# Patient Record
Sex: Male | Born: 1995 | Race: Black or African American | Hispanic: No | Marital: Single | Smoking: Never smoker
Health system: Southern US, Community
[De-identification: ages and names within clinical notes are randomized; demographics above are authoritative.]

## PROBLEM LIST (undated history)

## (undated) HISTORY — PX: ABDOMINAL SURGERY: SHX537

---
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
## 2018-06-13 ENCOUNTER — Emergency Department
Admission: EM | Admit: 2018-06-13 | Discharge: 2018-06-13 | Disposition: A | Discharge status: Home / Self Care | Payer: Medicaid Other | Attending: Emergency Medicine | Admitting: Emergency Medicine

## 2018-06-13 ENCOUNTER — Other Ambulatory Visit: Payer: Self-pay

## 2018-06-13 ENCOUNTER — Emergency Department: Payer: Medicaid Other

## 2018-06-13 DIAGNOSIS — Y9367 Activity, basketball: Secondary | ICD-10-CM | POA: Insufficient documentation

## 2018-06-13 DIAGNOSIS — S63502A Unspecified sprain of left wrist, initial encounter: Secondary | ICD-10-CM | POA: Insufficient documentation

## 2018-06-13 DIAGNOSIS — Y998 Other external cause status: Secondary | ICD-10-CM | POA: Insufficient documentation

## 2018-06-13 DIAGNOSIS — X58XXXA Exposure to other specified factors, initial encounter: Secondary | ICD-10-CM | POA: Insufficient documentation

## 2018-06-13 DIAGNOSIS — Y929 Unspecified place or not applicable: Secondary | ICD-10-CM | POA: Insufficient documentation

## 2018-06-13 NOTE — ED Triage Notes (Signed)
C/o L wrist pain since Monday. Was playing bball Monday morning but denies known injury. A&O, ambulatory. No distress noted.

## 2018-06-13 NOTE — ED Provider Notes (Signed)
[facility] Emergency Department Provider Note  ____________________________________________   First MD Initiated Contact with Patient 06/13/18 1926     (approximate)  I have reviewed the triage vital signs and the nursing notes.   HISTORY  Chief Complaint Wrist Pain    HPI Norman Rubio is a 23 y.o. male states he was playing basketball early Monday morning and started having wrist pain.  He denies any type of fall.  He states it hurts to move it and bend it.  He denies any numbness or tingling.  He states he needs a work note.    History reviewed. No pertinent past medical history.  There are no active problems to display for this patient.   Past Surgical History:  Procedure Laterality Date  . ABDOMINAL SURGERY     unknown, occurred as infant    Prior to Admission medications   Not on File    Allergies Patient has no known allergies.  History reviewed. No pertinent family history.  Social History Social History   Tobacco Use  . Smoking status: Never Smoker  . Smokeless tobacco: Never Used  Substance Use Topics  . Alcohol use: No  . Drug use: Not on file    Review of Systems  Constitutional: No fever/chills Eyes: No visual changes. ENT: No sore throat. Respiratory: Denies cough Genitourinary: Negative for dysuria. Musculoskeletal: Negative for back pain.  Positive for left wrist pain Skin: Negative for rash.    ____________________________________________   PHYSICAL EXAM:  VITAL SIGNS: ED Triage Vitals  Enc Vitals Group     BP 06/13/18 1837 (!) 146/88     Pulse Rate 06/13/18 1837 69     Resp 06/13/18 1837 18     Temp 06/13/18 1837 98.7 F (37.1 C)     Temp Source 06/13/18 1837 Oral     SpO2 06/13/18 1837 99 %     Weight 06/13/18 1837 190 lb (86.2 kg)     Height 06/13/18 1837 5\' 11"  (1.803 m)     Head Circumference --      Peak Flow --      Pain Score 06/13/18 1839 7     Pain Loc --      Pain Edu? --    Excl. in [place]? --     Constitutional: Alert and oriented. Well appearing and in no acute distress. Eyes: Conjunctivae are normal.  Head: Atraumatic. Nose: No congestion/rhinnorhea. Mouth/Throat: Mucous membranes are moist.   Neck:  supple no lymphadenopathy noted Cardiovascular: Normal rate, regular rhythm.  Respiratory: Normal respiratory effort.  No retractions GU: deferred Musculoskeletal: FROM all extremities, warm and well perfused, the left wrist is mildly tender, neurovascular is intact Neurologic:  Normal speech and language.  Skin:  Skin is warm, dry and intact. No rash noted. Psychiatric: Mood and affect are normal. Speech and behavior are normal.  ____________________________________________   LABS (all labs ordered are listed, but only abnormal results are displayed)  Labs Reviewed - No data to display ____________________________________________   ____________________________________________  RADIOLOGY  X-ray of the left wrist is negative  ____________________________________________   PROCEDURES  Procedure(s) performed: Velcro cock-up splint applied by the tech   Procedures    ____________________________________________   INITIAL IMPRESSION / ASSESSMENT AND PLAN / ED COURSE  Pertinent labs & imaging results that were available during my care of the patient were reviewed by me and considered in my medical decision making (see chart for details).   Patient is 23 year old male presents emergency  department complaining of left wrist pain  Physical exam shows left wrist is mildly tender, pain is reproduced with range of motion  X-ray left wrist is negative  Cock-up splint was applied by the tech.  He is to follow-up with orthopedics if not better in 5 to 7 days.  Is given a work note as requested.  Take Tylenol and ibuprofen and apply ice.     As part of my medical decision making, I reviewed the following data within the electronic MEDICAL RECORD NUMBER  Nursing notes reviewed and incorporated, Old chart reviewed, Radiograph reviewed x-ray of the left wrist is negative, Notes from prior ED visits and [place] Controlled Substance Database  ____________________________________________   FINAL CLINICAL IMPRESSION(S) / ED DIAGNOSES  Final diagnoses:  Sprain of left wrist, initial encounter      NEW MEDICATIONS STARTED DURING THIS VISIT:  New Prescriptions   No medications on file     Note:  This document was prepared using Dragon voice recognition software and may include unintentional dictation errors.    Faythe Ghee, PA-C 06/13/18 1950    Phineas Semen, MD 06/13/18 Rosamaria Lints

## 2018-06-13 NOTE — Discharge Instructions (Signed)
Take ibuprofen or Tylenol for pain as needed.  Elevate and ice the wrist.  Return if worsening.  Follow-up with orthopedics if not better in a week.

## 2018-09-18 ENCOUNTER — Other Ambulatory Visit: Payer: Self-pay

## 2018-09-18 ENCOUNTER — Emergency Department
Admission: EM | Admit: 2018-09-18 | Discharge: 2018-09-18 | Disposition: A | Discharge status: Home / Self Care | Payer: Medicaid Other | Attending: Emergency Medicine | Admitting: Emergency Medicine

## 2018-09-18 DIAGNOSIS — M25562 Pain in left knee: Secondary | ICD-10-CM | POA: Insufficient documentation

## 2018-09-18 DIAGNOSIS — G8929 Other chronic pain: Secondary | ICD-10-CM | POA: Insufficient documentation

## 2018-09-18 DIAGNOSIS — M545 Low back pain: Secondary | ICD-10-CM | POA: Insufficient documentation

## 2018-09-18 MED ORDER — KETOROLAC TROMETHAMINE 30 MG/ML IJ SOLN
30.0000 mg | Freq: Once | INTRAMUSCULAR | Status: AC
  Administered 2018-09-18: 30 mg via INTRAMUSCULAR
  Filled 2018-09-18: qty 1

## 2018-09-18 MED ORDER — CYCLOBENZAPRINE HCL 5 MG PO TABS: ORAL_TABLET | ORAL | 0 refills | Status: DC

## 2018-09-18 MED ORDER — MELOXICAM 15 MG PO TABS: 15.0000 mg | ORAL_TABLET | Freq: Every day | ORAL | 0 refills | Status: AC

## 2018-09-18 NOTE — ED Triage Notes (Signed)
Pt c/o lower back and left knee pain since playing basketball yesterday. Denies injury

## 2018-09-18 NOTE — ED Provider Notes (Signed)
[facility] Emergency Department Provider Note  ____________________________________________  Time seen: Approximately 10:52 AM  I have reviewed the triage vital signs and the nursing notes.   HISTORY  Chief Complaint Back Pain and Knee Pain    HPI Norman MohJaquan Tumbleson is a 23 y.o. male that presents to the emergency department for evaluation of left knee pain and low back pain for "a minute.  "Patient states pain has been at least for a couple of months.  He states he was evaluated in this emergency department for this previously.  He has had a referral to orthopedics in the past but has never followed up.  Pain is worse when he is on his feet all day or when he is playing basketball.  He plays basketball on concrete.  No trauma.  No urinary symptoms or history of kidney stone.  History reviewed. No pertinent past medical history.  There are no active problems to display for this patient.   Past Surgical History:  Procedure Laterality Date  . ABDOMINAL SURGERY     unknown, occurred as infant    Prior to Admission medications   Medication Sig Start Date End Date Taking? Authorizing Provider  cyclobenzaprine (FLEXERIL) 5 MG tablet Take 1-2 tablets 3 times daily as needed 09/18/18   Enid DerryWagner, Shenee Wignall, PA-C  meloxicam (MOBIC) 15 MG tablet Take 1 tablet (15 mg total) by mouth daily for 10 days. 09/18/18 09/28/18  Enid DerryWagner, Landrie Beale, PA-C    Allergies Patient has no known allergies.  No family history on file.  Social History Social History   Tobacco Use  . Smoking status: Never Smoker  . Smokeless tobacco: Never Used  Substance Use Topics  . Alcohol use: No  . Drug use: Not Currently     Review of Systems  Cardiovascular: No chest pain. Respiratory: No SOB. Gastrointestinal: No abdominal pain.  No nausea, no vomiting.  Genitourinary: Negative for dysuria. Musculoskeletal: Positive for knee and back pain. Skin: Negative for rash, abrasions, lacerations,  ecchymosis. Neurological: Negative for headaches, numbness or tingling   ____________________________________________   PHYSICAL EXAM:  VITAL SIGNS: ED Triage Vitals [09/18/18 1038]  Enc Vitals Group     BP 134/81     Pulse Rate (!) 58     Resp 16     Temp 98.1 F (36.7 C)     Temp Source Oral     SpO2 100 %     Weight 170 lb (77.1 kg)     Height 6' (1.829 m)     Head Circumference      Peak Flow      Pain Score 8     Pain Loc      Pain Edu?      Excl. in GC?      Constitutional: Alert and oriented. Well appearing and in no acute distress. Eyes: Conjunctivae are normal. PERRL. EOMI. Head: Atraumatic. ENT:      Ears:      Nose: No congestion/rhinnorhea.      Mouth/Throat: Mucous membranes are moist.  Neck: No stridor.  Cardiovascular: Normal rate, regular rhythm.  Good peripheral circulation. Respiratory: Normal respiratory effort without tachypnea or retractions. Lungs CTAB. Good air entry to the bases with no decreased or absent breath sounds. Musculoskeletal: Full range of motion to all extremities. No gross deformities appreciated.  Full range of motion of left knee without pain.  Mild tenderness to palpation through lumbar spine and lumbar paraspinal muscles.  No pinpoint tenderness to palpation.  Full  range of motion with flexion, extension, rotation of the lumbar spine.  Normal gait. Neurologic:  Normal speech and language. No gross focal neurologic deficits are appreciated.  Skin:  Skin is warm, dry and intact. No rash noted. Psychiatric: Mood and affect are normal. Speech and behavior are normal. Patient exhibits appropriate insight and judgement.   ____________________________________________   LABS (all labs ordered are listed, but only abnormal results are displayed)  Labs Reviewed - No data to display ____________________________________________  EKG   ____________________________________________  RADIOLOGY  No results  found.  ____________________________________________    PROCEDURES  Procedure(s) performed:    Procedures    Medications  ketorolac (TORADOL) 30 MG/ML injection 30 mg (30 mg Intramuscular Given 09/18/18 1145)     ____________________________________________   INITIAL IMPRESSION / ASSESSMENT AND PLAN / ED COURSE  Pertinent labs & imaging results that were available during my care of the patient were reviewed by me and considered in my medical decision making (see chart for details).  Review of the [place] CSRS was performed in accordance of the [facility] prior to dispensing any controlled drugs.     Patient presented the emergency department for evaluation of low back pain and left knee pain for several months.  Pain has not changed in character today.  Exam is overall unremarkable.  Patient will be discharged home with prescriptions for Mobic. Patient is to follow up with orthopedics as directed. Patient is given ED precautions to return to the ED for any worsening or new symptoms.     ____________________________________________  FINAL CLINICAL IMPRESSION(S) / ED DIAGNOSES  Final diagnoses:  Acute pain of left knee  Chronic midline low back pain without sciatica      NEW MEDICATIONS STARTED DURING THIS VISIT:  ED Discharge Orders         Ordered    Knee brace     09/18/18 1129    cyclobenzaprine (FLEXERIL) 5 MG tablet     09/18/18 1137    meloxicam (MOBIC) 15 MG tablet  Daily     09/18/18 1137              This chart was dictated using voice recognition software/Dragon. Despite best efforts to proofread, errors can occur which can change the meaning. Any change was purely unintentional.    Enid Derry, PA-C 09/18/18 1517    Minna Antis, MD 09/18/18 1536

## 2018-09-18 NOTE — Discharge Instructions (Addendum)
I have given you a prescription for Flexeril to help relax your muscles and low back for inflammation in your joints.  Try heat to your lower back.  You can also ice your knee if it is bothering you after basketball.  Please make an appointment with Ortho for further evaluation.

## 2018-09-18 NOTE — ED Notes (Signed)
See triage note  Presents with lower back pain and left knee pain  States he was playing b/b/ yesterday.  States he did fall couple of times   No swelling noted  Ambulates well to treatment area

## 2018-12-28 ENCOUNTER — Other Ambulatory Visit: Payer: Self-pay

## 2018-12-28 DIAGNOSIS — Z20822 Contact with and (suspected) exposure to covid-19: Secondary | ICD-10-CM

## 2018-12-29 LAB — NOVEL CORONAVIRUS, NAA: SARS-CoV-2, NAA: NOT DETECTED

## 2019-01-08 ENCOUNTER — Other Ambulatory Visit: Payer: Self-pay

## 2019-01-08 DIAGNOSIS — Z20822 Contact with and (suspected) exposure to covid-19: Secondary | ICD-10-CM

## 2019-01-09 LAB — NOVEL CORONAVIRUS, NAA: SARS-CoV-2, NAA: NOT DETECTED

## 2019-04-15 ENCOUNTER — Other Ambulatory Visit: Payer: Self-pay

## 2019-04-15 DIAGNOSIS — Z20822 Contact with and (suspected) exposure to covid-19: Secondary | ICD-10-CM

## 2019-04-16 LAB — NOVEL CORONAVIRUS, NAA: SARS-CoV-2, NAA: NOT DETECTED

## 2019-05-19 ENCOUNTER — Emergency Department
Admission: EM | Admit: 2019-05-19 | Discharge: 2019-05-19 | Disposition: A | Discharge status: Home / Self Care | Payer: Medicaid Other | Attending: Emergency Medicine | Admitting: Emergency Medicine

## 2019-05-19 ENCOUNTER — Other Ambulatory Visit: Payer: Self-pay

## 2019-05-19 DIAGNOSIS — R040 Epistaxis: Secondary | ICD-10-CM | POA: Insufficient documentation

## 2019-05-19 NOTE — Discharge Instructions (Signed)
Follow-up with Dr. Jenne Campus if you continue to have nosebleeds.  Also clamp your nose for 15 minutes as we discussed.  You may also use saline nose spray such as Ocean mist or generic saline nose spray to the add moisture to your nose.  Also if you are house is dry from using the heat you will need to sit a pot of water or run a humidifier to add moisture to the ear.

## 2019-05-19 NOTE — ED Triage Notes (Signed)
FIRST NURSE NOTE- c/o bleeding from right nare for past 2 days. No active bleeding. Ambulatory. NAD

## 2019-05-19 NOTE — ED Provider Notes (Signed)
[facility] Emergency Department Provider Note  ____________________________________________   First MD Initiated Contact with Patient 05/19/19 1403     (approximate)  I have reviewed the triage vital signs and the nursing notes.   HISTORY  Chief Complaint Epistaxis   HPI Norman Rubio is a 24 y.o. male presents to the ED with complaint of left sided nosebleed for the last 2 days.  Patient states that his nose feels like it is "burning".  Patient states that the last time his nose bled was yesterday.  He denies any injury to his nose nor is he taking any aspirin.  He has a non-smoker, he denies any fever, chills, nausea or vomiting.  He denies any known exposure to Covid.     History reviewed. No pertinent past medical history.  There are no problems to display for this patient.   Past Surgical History:  Procedure Laterality Date  . ABDOMINAL SURGERY     unknown, occurred as infant    Prior to Admission medications   Medication Sig Start Date End Date Taking? Authorizing Provider  cyclobenzaprine (FLEXERIL) 5 MG tablet Take 1-2 tablets 3 times daily as needed 09/18/18   Laban Emperor, PA-C    Allergies Patient has no known allergies.  No family history on file.  Social History Social History   Tobacco Use  . Smoking status: Never Smoker  . Smokeless tobacco: Never Used  Substance Use Topics  . Alcohol use: No  . Drug use: Not Currently    Review of Systems Constitutional: No fever/chills Eyes: No visual changes. ENT: Positive for left-sided nosebleed Cardiovascular: Denies chest pain. Respiratory: Denies shortness of breath.  Negative for cough. Gastrointestinal: No abdominal pain.  No nausea, no vomiting.  Genitourinary: Negative for dysuria. Musculoskeletal: Negative for back pain. Skin: Negative for rash. Neurological: Negative for headaches, focal weakness or  numbness. ____________________________________________   PHYSICAL EXAM:  VITAL SIGNS: ED Triage Vitals  Enc Vitals Group     BP 05/19/19 1250 (!) 151/91     Pulse Rate 05/19/19 1250 63     Resp 05/19/19 1250 18     Temp --      Temp src --      SpO2 05/19/19 1250 100 %     Weight 05/19/19 1251 170 lb (77.1 kg)     Height 05/19/19 1251 6' (1.829 m)     Head Circumference --      Peak Flow --      Pain Score 05/19/19 1251 2     Pain Loc --      Pain Edu? --      Excl. in [place]? --    Constitutional: Alert and oriented. Well appearing and in no acute distress. Eyes: Conjunctivae are normal.  Head: Atraumatic. Nose: No congestion/rhinnorhea.  Both right and left naris was examined.  There is no active bleeding on either side and no evidence of trauma.  This appears to be normal.  Turbinates are nonswollen. Mouth/Throat: Mucous membranes are moist.  Oropharynx non-erythematous. Neck: No stridor.   Cardiovascular: Normal rate, regular rhythm. Grossly normal heart sounds.  Good peripheral circulation. Respiratory: Normal respiratory effort.  No retractions. Lungs CTAB. Neurologic:  Normal speech and language. No gross focal neurologic deficits are appreciated. No gait instability. Skin:  Skin is warm, dry and intact. No rash noted. Psychiatric: Mood and affect are normal. Speech and behavior are normal.  ____________________________________________   LABS (all labs ordered are listed, but only abnormal results  are displayed)  Labs Reviewed - No data to display   PROCEDURES  Procedure(s) performed (including Critical Care):  Procedures   ____________________________________________   INITIAL IMPRESSION / ASSESSMENT AND PLAN / ED COURSE  As part of my medical decision making, I reviewed the following data within the electronic MEDICAL RECORD NUMBER Notes from prior ED visits and [place] Controlled Substance Database  Kort Stettler was evaluated in Emergency Department on 05/19/2019  for the symptoms described in the history of present illness. He was evaluated in the context of the global COVID-19 pandemic, which necessitated consideration that the patient might be at risk for infection with the SARS-CoV-2 virus that causes COVID-19. Institutional protocols and algorithms that pertain to the evaluation of patients at risk for COVID-19 are in a state of rapid change based on information released by regulatory bodies including the CDC and federal and state organizations. These policies and algorithms were followed during the patient's care in the ED.  24 year old male presents to the ED with complaint of 2 days intermittent left-sided nosebleeds.  Patient states that his last nosebleed was yesterday.  He denies any fever, chills, nausea or vomiting.  He denies any known exposure to Covid.  On exam there was no active nosebleed and no evidence of trauma.  Patient reports that he needs a work note as he did not go to work today.  Patient was advised to use Ocean mist or any saline nose spray to provide moisture to his nose.  He also is to follow-up with Dr. Jenne Campus if he continues to have nosebleeds. ____________________________________________   FINAL CLINICAL IMPRESSION(S) / ED DIAGNOSES  Final diagnoses:  Left-sided epistaxis     ED Discharge Orders    None       Note:  This document was prepared using Dragon voice recognition software and may include unintentional dictation errors.    Tommi Rumps, PA-C 05/19/19 1430    Minna Antis, MD 05/19/19 [phone]

## 2019-05-19 NOTE — ED Triage Notes (Signed)
Pt to ED via POV. C/o nose bleed from right nare x 2 days. States nose is "burning".  Pt in NAD at this time. No bleeding at this time.

## 2019-07-05 ENCOUNTER — Emergency Department
Admission: EM | Admit: 2019-07-05 | Discharge: 2019-07-05 | Disposition: A | Discharge status: Home / Self Care | Payer: Self-pay | Attending: Emergency Medicine | Admitting: Emergency Medicine

## 2019-07-05 ENCOUNTER — Encounter: Payer: Self-pay | Admitting: Emergency Medicine

## 2019-07-05 ENCOUNTER — Emergency Department: Payer: Self-pay

## 2019-07-05 ENCOUNTER — Other Ambulatory Visit: Payer: Self-pay

## 2019-07-05 DIAGNOSIS — Y92810 Car as the place of occurrence of the external cause: Secondary | ICD-10-CM | POA: Insufficient documentation

## 2019-07-05 DIAGNOSIS — Y998 Other external cause status: Secondary | ICD-10-CM | POA: Insufficient documentation

## 2019-07-05 DIAGNOSIS — S60221A Contusion of right hand, initial encounter: Secondary | ICD-10-CM | POA: Insufficient documentation

## 2019-07-05 DIAGNOSIS — Z0279 Encounter for issue of other medical certificate: Secondary | ICD-10-CM | POA: Insufficient documentation

## 2019-07-05 DIAGNOSIS — Y9389 Activity, other specified: Secondary | ICD-10-CM | POA: Insufficient documentation

## 2019-07-05 DIAGNOSIS — W228XXA Striking against or struck by other objects, initial encounter: Secondary | ICD-10-CM | POA: Insufficient documentation

## 2019-07-05 NOTE — ED Notes (Signed)
See triage note   States he punched his steering wheel  Having pain to right hand  Pain is mainly to middle knuckle

## 2019-07-05 NOTE — ED Provider Notes (Signed)
[facility] Emergency Department Provider Note ____________________________________________  Time seen: 1604  I have reviewed the triage vital signs and the nursing notes.  HISTORY  Chief Complaint  Hand Pain  HPI [name] is a 24 y.o. right handed male who presents to the ED with  complains of right hand pain after he intentionally struck a steering wheel.  Patient reports of swelling to the dorsal hand but no obvious deformity is reported.  He denies any other injury at this time.  Patient is requesting a work note.  History reviewed. No pertinent past medical history.  There are no problems to display for this patient.   Past Surgical History:  Procedure Laterality Date  . ABDOMINAL SURGERY     unknown, occurred as infant    Prior to Admission medications   Not on File    Allergies Patient has no known allergies.  History reviewed. No pertinent family history.  Social History Social History   Tobacco Use  . Smoking status: Never Smoker  . Smokeless tobacco: Never Used  Substance Use Topics  . Alcohol use: No  . Drug use: Not Currently    Review of Systems  Constitutional: Negative for fever. Cardiovascular: Negative for chest pain. Respiratory: Negative for shortness of breath. Musculoskeletal: Negative for back pain.  Right hand pain as above. Skin: Negative for rash. Neurological: Negative for headaches, focal weakness or numbness. ____________________________________________  PHYSICAL EXAM:  VITAL SIGNS: ED Triage Vitals  Enc Vitals Group     BP 07/05/19 1508 140/88     Pulse Rate 07/05/19 1508 69     Resp 07/05/19 1508 16     Temp 07/05/19 1508 98.6 F (37 C)     Temp Source 07/05/19 1508 Oral     SpO2 07/05/19 1508 100 %     Weight 07/05/19 1509 180 lb (81.6 kg)     Height 07/05/19 1509 6' (1.829 m)     Head Circumference --      Peak Flow --      Pain Score 07/05/19 1509 7     Pain Loc --      Pain Edu? --       Excl. in [place]? --     Constitutional: Alert and oriented. Well appearing and in no distress. Head: Normocephalic and atraumatic. Eyes: Conjunctivae are normal. Normal extraocular movements Cardiovascular: Normal rate, regular rhythm. Normal distal pulses. Respiratory: Normal respiratory effort. No wheezes/rales/rhonchi. Musculoskeletal: Right hand without obvious deformity, dislocation, or effusion.  Normal composite fist on exam.  Nontender with normal range of motion in all extremities.  Neurologic:  Normal gross sensation.  Normal intrinsic and opposition testing noted.  Normal speech and language. No gross focal neurologic deficits are appreciated. Skin:  Skin is warm, dry and intact. No rash noted. Psychiatric: Mood and affect are normal. Patient exhibits appropriate insight and judgment. ____________________________________________   RADIOLOGY  DG Right Hand  IMPRESSION: Negative.  I, Melvenia Needles, personally viewed and evaluated these images (plain radiographs) as part of my medical decision making, as well as reviewing the written report by the radiologist. ____________________________________________  PROCEDURES  Procedures ____________________________________________  INITIAL IMPRESSION / ASSESSMENT AND PLAN / ED COURSE  Patient presents to the ED with right hand pain after intentionally punching a stairwell.  He presents with swelling across the have no obvious dislocation.  No x-ray evidence of acute fracture or dislocation.  Patient will be treated for his hand contusion with anti-inflammatories and RICE management.  He will follow-up with his primary provider as needed.  Norman Rubio was evaluated in Emergency Department on 07/05/2019 for the symptoms described in the history of present illness. He was evaluated in the context of the global COVID-19 pandemic, which necessitated consideration that the patient might be at risk for infection with the SARS-CoV-2  virus that causes COVID-19. Institutional protocols and algorithms that pertain to the evaluation of patients at risk for COVID-19 are in a state of rapid change based on information released by regulatory bodies including the CDC and federal and state organizations. These policies and algorithms were followed during the patient's care in the ED. ____________________________________________  FINAL CLINICAL IMPRESSION(S) / ED DIAGNOSES  Final diagnoses:  Contusion of right hand, initial encounter      Lissa Hoard, PA-C 07/05/19 1636    Minna Antis, MD 07/06/19 1416

## 2019-07-05 NOTE — Discharge Instructions (Signed)
Your exam and XR are negative for any break (fracture) to the hand. Follow-up with [facility] as needed. Apply ice and take OTC ibuprofen as needed.

## 2019-07-05 NOTE — ED Triage Notes (Signed)
Pt presents with right hand pain after striking a steering wheel. Mild swelling noted to right hand but no deformity. No radiation

## 2019-08-01 ENCOUNTER — Ambulatory Visit: Payer: Medicaid Other | Attending: Internal Medicine

## 2019-08-01 DIAGNOSIS — Z20822 Contact with and (suspected) exposure to covid-19: Secondary | ICD-10-CM

## 2019-08-02 LAB — SARS-COV-2, NAA 2 DAY TAT

## 2019-08-02 LAB — NOVEL CORONAVIRUS, NAA: SARS-CoV-2, NAA: NOT DETECTED

## 2019-08-05 ENCOUNTER — Ambulatory Visit: Payer: Medicaid Other

## 2021-07-04 ENCOUNTER — Encounter: Payer: Self-pay | Admitting: Emergency Medicine

## 2021-07-04 ENCOUNTER — Emergency Department
Admission: EM | Admit: 2021-07-04 | Discharge: 2021-07-04 | Disposition: A | Discharge status: Home / Self Care | Payer: Medicaid Other | Attending: Emergency Medicine | Admitting: Emergency Medicine

## 2021-07-04 ENCOUNTER — Emergency Department: Payer: Medicaid Other

## 2021-07-04 ENCOUNTER — Other Ambulatory Visit: Payer: Self-pay

## 2021-07-04 DIAGNOSIS — R059 Cough, unspecified: Secondary | ICD-10-CM | POA: Insufficient documentation

## 2021-07-04 DIAGNOSIS — Z20822 Contact with and (suspected) exposure to covid-19: Secondary | ICD-10-CM | POA: Insufficient documentation

## 2021-07-04 DIAGNOSIS — R0602 Shortness of breath: Secondary | ICD-10-CM | POA: Insufficient documentation

## 2021-07-04 DIAGNOSIS — R0789 Other chest pain: Secondary | ICD-10-CM | POA: Insufficient documentation

## 2021-07-04 LAB — CBC WITH DIFFERENTIAL/PLATELET
Abs Immature Granulocytes: 0.01 10*3/uL (ref 0.00–0.07)
Basophils Absolute: 0 10*3/uL (ref 0.0–0.1)
Basophils Relative: 0 %
Eosinophils Absolute: 0 10*3/uL (ref 0.0–0.5)
Eosinophils Relative: 0 %
HCT: 43.1 % (ref 39.0–52.0)
Hemoglobin: 13.8 g/dL (ref 13.0–17.0)
Immature Granulocytes: 0 %
Lymphocytes Relative: 25 %
Lymphs Abs: 1.2 10*3/uL (ref 0.7–4.0)
MCH: 25.7 pg — ABNORMAL LOW (ref 26.0–34.0)
MCHC: 32 g/dL (ref 30.0–36.0)
MCV: 80.3 fL (ref 80.0–100.0)
Monocytes Absolute: 0.4 10*3/uL (ref 0.1–1.0)
Monocytes Relative: 8 %
Neutro Abs: 3.2 10*3/uL (ref 1.7–7.7)
Neutrophils Relative %: 67 %
Platelets: 249 10*3/uL (ref 150–400)
RBC: 5.37 MIL/uL (ref 4.22–5.81)
RDW: 14.1 % (ref 11.5–15.5)
WBC: 4.8 10*3/uL (ref 4.0–10.5)
nRBC: 0 % (ref 0.0–0.2)

## 2021-07-04 LAB — COMPREHENSIVE METABOLIC PANEL
ALT: 21 U/L (ref 0–44)
AST: 28 U/L (ref 15–41)
Albumin: 3.9 g/dL (ref 3.5–5.0)
Alkaline Phosphatase: 50 U/L (ref 38–126)
Anion gap: 9 (ref 5–15)
BUN: 9 mg/dL (ref 6–20)
CO2: 28 mmol/L (ref 22–32)
Calcium: 8.9 mg/dL (ref 8.9–10.3)
Chloride: 103 mmol/L (ref 98–111)
Creatinine, Ser: 0.83 mg/dL (ref 0.61–1.24)
GFR, Estimated: >60 mL/min (ref >60)
Glucose, Bld: 81 mg/dL (ref 70–99)
Potassium: 3.6 mmol/L (ref 3.5–5.1)
Sodium: 140 mmol/L (ref 135–145)
Total Bilirubin: 1 mg/dL (ref 0.3–1.2)
Total Protein: 6.8 g/dL (ref 6.5–8.1)

## 2021-07-04 LAB — TROPONIN I (HIGH SENSITIVITY): Troponin I (High Sensitivity): 4 ng/L (ref <18)

## 2021-07-04 LAB — RESP PANEL BY RT-PCR (FLU A&B, COVID) ARPGX2
Influenza A by PCR: NEGATIVE
Influenza B by PCR: NEGATIVE
SARS Coronavirus 2 by RT PCR: NEGATIVE

## 2021-07-04 MED ORDER — AZITHROMYCIN 250 MG PO TABS: ORAL_TABLET | ORAL | 0 refills | Status: DC

## 2021-07-04 MED ORDER — KETOROLAC TROMETHAMINE 10 MG PO TABS: 10.0000 mg | ORAL_TABLET | Freq: Four times a day (QID) | ORAL | 0 refills | Status: DC | PRN

## 2021-07-04 MED ORDER — KETOROLAC TROMETHAMINE 30 MG/ML IJ SOLN
30.0000 mg | Freq: Once | INTRAMUSCULAR | Status: AC
  Administered 2021-07-04: 30 mg via INTRAVENOUS
  Filled 2021-07-04: qty 1

## 2021-07-04 NOTE — ED Provider Notes (Signed)
[facility] Provider Note    Event Date/Time   First MD Initiated Contact with Patient 07/04/21 1144     (approximate)   History   Chest Pain and Shortness of Breath   HPI  Norman Rubio is a 26 y.o. male presents emergency department for left-sided chest pain.  Patient states pain is worse with deep breath.  Has had a cough and feels short of breath.  States he cannot lay flat on his back due to the pain and shortness of breath but can lay on his stomach.  Had a COVID test 2 days ago which was negative.  He denies any abdominal pain, vomiting, diarrhea.  No family history of heart disease.  No history IV drug use      Physical Exam   Triage Vital Signs: ED Triage Vitals  Enc Vitals Group     BP 07/04/21 1136 135/88     Pulse Rate 07/04/21 1136 (!) 55     Resp 07/04/21 1136 18     Temp 07/04/21 1136 99.1 F (37.3 C)     Temp Source 07/04/21 1136 Oral     SpO2 07/04/21 1136 98 %     Weight 07/04/21 1121 180 lb (81.6 kg)     Height 07/04/21 1121 6' (1.829 m)     Head Circumference --      Peak Flow --      Pain Score 07/04/21 1120 8     Pain Loc --      Pain Edu? --      Excl. in [place]? --     Most recent vital signs: Vitals:   07/04/21 1136 07/04/21 1145  BP: 135/88 139/88  Pulse: (!) 55 (!) 59  Resp: 18 16  Temp: 99.1 F (37.3 C)   SpO2: 98% 100%     General: Awake, no distress.   CV:  Good peripheral perfusion.  Bradycardic, regular rhythm  resp:  Normal effort. Lungs CTA Abd:  No distention.  Nontender Other:      ED Results / Procedures / Treatments   Labs (all labs ordered are listed, but only abnormal results are displayed) Labs Reviewed  CBC WITH DIFFERENTIAL/PLATELET - Abnormal; Notable for the following components:      Result Value   MCH 25.7 (*)    All other components within normal limits  RESP PANEL BY RT-PCR (FLU A&B, COVID) [id number]  COMPREHENSIVE METABOLIC PANEL  TROPONIN I (HIGH SENSITIVITY)  TROPONIN I  (HIGH SENSITIVITY)     EKG  EKG   RADIOLOGY Chest x-ray    PROCEDURES:   Procedures   MEDICATIONS ORDERED IN ED: Medications  ketorolac (TORADOL) 30 MG/ML injection 30 mg (has no administration in time range)     IMPRESSION / MDM / ASSESSMENT AND PLAN / ED COURSE  I reviewed the triage vital signs and the nursing notes.                              Differential diagnosis includes, but is not limited to, COVID, influenza, CAP, pericarditis, MI  Labs and imaging ordered, patient appears to be stable at this time  EKG shows bradycardia, regular rhythm, see physician read  Chest x-ray was independently reviewed by me.  Do not see any acute abnormality.  Confirmed by radiology  Labs are reassuring, CBC, metabolic panel, troponin are all normal, respiratory panel is negative for COVID or influenza.  I  do feel this may be more of an inflammation and patient may have a viral illness.  Do not feel he has pericarditis as he is very comfortable laying back on the stretcher.  PERC test is negative still feel patient has a PE.  Troponin is normal so patient has not had an MI.  I did explain all the findings to the patient.  He was given Toradol through his IV for inflammation and comfort.  He will be given a prescription for Toradol at discharge, Z-Pak to prevent infection.  Follow-up with his regular doctor if not improving to 3 days.  Return if worsening.  He is in stable condition at time of discharge.  Patient does understand strict precautions for return.       FINAL CLINICAL IMPRESSION(S) / ED DIAGNOSES   Final diagnoses:  Chest wall pain     Rx / DC Orders   ED Discharge Orders          Ordered    ketorolac (TORADOL) 10 MG tablet  Every 6 hours PRN       Note to Pharmacy: Loading dose given in ER   07/04/21 1331    azithromycin (ZITHROMAX Z-PAK) 250 MG tablet        07/04/21 1331             Note:  This document was prepared using Dragon voice  recognition software and may include unintentional dictation errors.    Versie Starks, PA-C 07/04/21 1332    Lucrezia Starch, MD 07/04/21 [phone]

## 2021-07-04 NOTE — ED Triage Notes (Signed)
Pt reports pain to his left chest intermittently for the past week. Pt describes the pain as sharp and stabbing and reports gets worse with deep breaths. Pt reports when he takes a deep breath he feels SOB. Pt denies cough, congestion, recent injuries. Pt ambulatory in NAD, able to speak in full sentences

## 2021-07-04 NOTE — Discharge Instructions (Signed)
Follow-up with your regular doctor if not improving in 3 to 4 days.  Return emergency department worsening.  Take medication as prescribed. 

## 2021-07-04 NOTE — ED Notes (Signed)
Patient taken to imaging. 

## 2023-10-22 ENCOUNTER — Emergency Department

## 2023-10-22 ENCOUNTER — Other Ambulatory Visit: Payer: Self-pay

## 2023-10-22 ENCOUNTER — Emergency Department
Admission: EM | Admit: 2023-10-22 | Discharge: 2023-10-22 | Disposition: A | Discharge status: Home / Self Care | Attending: Emergency Medicine | Admitting: Emergency Medicine

## 2023-10-22 DIAGNOSIS — S63502A Unspecified sprain of left wrist, initial encounter: Secondary | ICD-10-CM | POA: Diagnosis not present

## 2023-10-22 DIAGNOSIS — Y990 Civilian activity done for income or pay: Secondary | ICD-10-CM | POA: Insufficient documentation

## 2023-10-22 DIAGNOSIS — W208XXA Other cause of strike by thrown, projected or falling object, initial encounter: Secondary | ICD-10-CM | POA: Diagnosis not present

## 2023-10-22 DIAGNOSIS — S6992XA Unspecified injury of left wrist, hand and finger(s), initial encounter: Secondary | ICD-10-CM | POA: Diagnosis present

## 2023-10-22 MED ORDER — ACETAMINOPHEN 325 MG PO TABS
650.0000 mg | ORAL_TABLET | Freq: Once | ORAL | Status: AC
  Administered 2023-10-22: 650 mg via ORAL
  Filled 2023-10-22: qty 2

## 2023-10-22 MED ORDER — IBUPROFEN 400 MG PO TABS
400.0000 mg | ORAL_TABLET | Freq: Once | ORAL | Status: AC
  Administered 2023-10-22: 400 mg via ORAL
  Filled 2023-10-22: qty 1

## 2023-10-22 NOTE — ED Triage Notes (Signed)
 POV with CC of L wrist pain after taking trash out at work.

## 2023-10-22 NOTE — ED Provider Notes (Signed)
 [facility] Provider Note    Event Date/Time   First MD Initiated Contact with Patient 10/22/23 2056     (approximate)   History   Wrist Pain   HPI  KIRKLAND FIGG is a 28 y.o. male presenting to the emergency department for left wrist pain since yesterday.  Patient states he was taking out the trash at work and decided to throw it over into the bin when his wrist started hurting.  Denies fever, fall, erythema, edema.  He has not taken anything for pain since that incident.  No pertinent past medical history. No history of gout or wrist or hand surgeries. Pain 7/10 in triage.     Physical Exam   Triage Vital Signs: ED Triage Vitals  Encounter Vitals Group     BP 10/22/23 2052 (!) 142/88     Girls Systolic BP Percentile --      Girls Diastolic BP Percentile --      Boys Systolic BP Percentile --      Boys Diastolic BP Percentile --      Pulse Rate 10/22/23 2052 84     Resp 10/22/23 2052 18     Temp 10/22/23 2052 99.2 F (37.3 C)     Temp Source 10/22/23 2052 Oral     SpO2 10/22/23 2052 100 %     Weight 10/22/23 2051 197 lb (89.4 kg)     Height 10/22/23 2051 6' 1 (1.854 m)     Head Circumference --      Peak Flow --      Pain Score 10/22/23 2050 7     Pain Loc --      Pain Education --      Exclude from Growth Chart --     Most recent vital signs: Vitals:   10/22/23 2052  BP: (!) 142/88  Pulse: 84  Resp: 18  Temp: 99.2 F (37.3 C)  SpO2: 100%    General: Awake, no distress.  CV:  Good peripheral perfusion.  Resp:  Normal effort.  Abd:  No distention.  Other:  Normal range of motion and 5 out of 5 strength in bilateral fingers, wrist and arms.  Radial pulses 2+ bilaterally. No evidence of any rash, lesion, abrasion.  No tenderness on the thenar eminence or snuffbox region. Tender to palpation at distal ulna on posterior side.  No obvious deformities or swelling.   ED Results / Procedures / Treatments   Labs (all labs ordered  are listed, but only abnormal results are displayed) Labs Reviewed - No data to display   EKG     RADIOLOGY  X-ray left wrist ordered.  PROCEDURES:  Critical Care performed: No  Procedures   MEDICATIONS ORDERED IN ED: Medications  acetaminophen (TYLENOL) tablet 650 mg (650 mg Oral Given 10/22/23 2132)  ibuprofen  (ADVIL ) tablet 400 mg (400 mg Oral Given 10/22/23 2132)     IMPRESSION / MDM / ASSESSMENT AND PLAN / ED COURSE  I reviewed the triage vital signs and the nursing notes.                              Differential diagnosis includes, but is not limited to, left wrist sprain, distal ulnar fracture, carpal bone fracture  Patient's presentation is most consistent with acute complicated illness / injury requiring diagnostic workup.  Patient is a 28 year old male today presenting with left wrist pain following a mechanical injury where  he threw a trash bag into a bin and felt immediate wrist pain.  He has pain with wrist flexion and extension.  Patient was given 650 mg of Tylenol and 400 mg of ibuprofen  in the emergency department. X-ray ordered, shows no acute fracture or dislocation.  I personally viewed the x-ray and radiology report and agree with these findings.   Patient will be placed in a wrist brace that he can wear for 4 to 6 weeks while this is healing.  He can take Tylenol and or ibuprofen  as needed for pain at home and do the RICE method.  He should follow-up with his primary care provider as needed or for any new or concerning symptoms.  Patient was given the opportunity ask questions, all questions were answered.  Emergency department return precautions were discussed with the patient.  Patient is in agreement to the treatment plan.  Patient is stable for discharge.    FINAL CLINICAL IMPRESSION(S) / ED DIAGNOSES   Final diagnoses:  Sprain of left wrist, initial encounter     Rx / DC Orders   ED Discharge Orders     None        Note:  This  document was prepared using Dragon voice recognition software and may include unintentional dictation errors.    Thomasenia Flesher, PA-C 10/22/23 2207    Claria Crofts, MD 10/22/23 [phone]

## 2023-10-22 NOTE — Discharge Instructions (Addendum)
 You were seen in the emergency department for left wrist pain.  Your x-ray did not show any acute fracture or dislocation.  You will be provided a brace.  Please wear it for the next 4 to 6 weeks as this heals. You may take it off to shower. You can use Tylenol and/or ibuprofen  based on the package instructions as needed for pain.

## 2023-11-26 IMAGING — CR DG CHEST 2V
1 series · 2 of 2 positions shown · non-contrast
Comparison: None.

CLINICAL DATA: Left-sided pleuritic chest pain for 1 week.

EXAM:
CHEST - 2 VIEW

[Series 1: dg chest 2 view · 0.14mm/px · 2 of 2 slices shown]
[im 1/2]
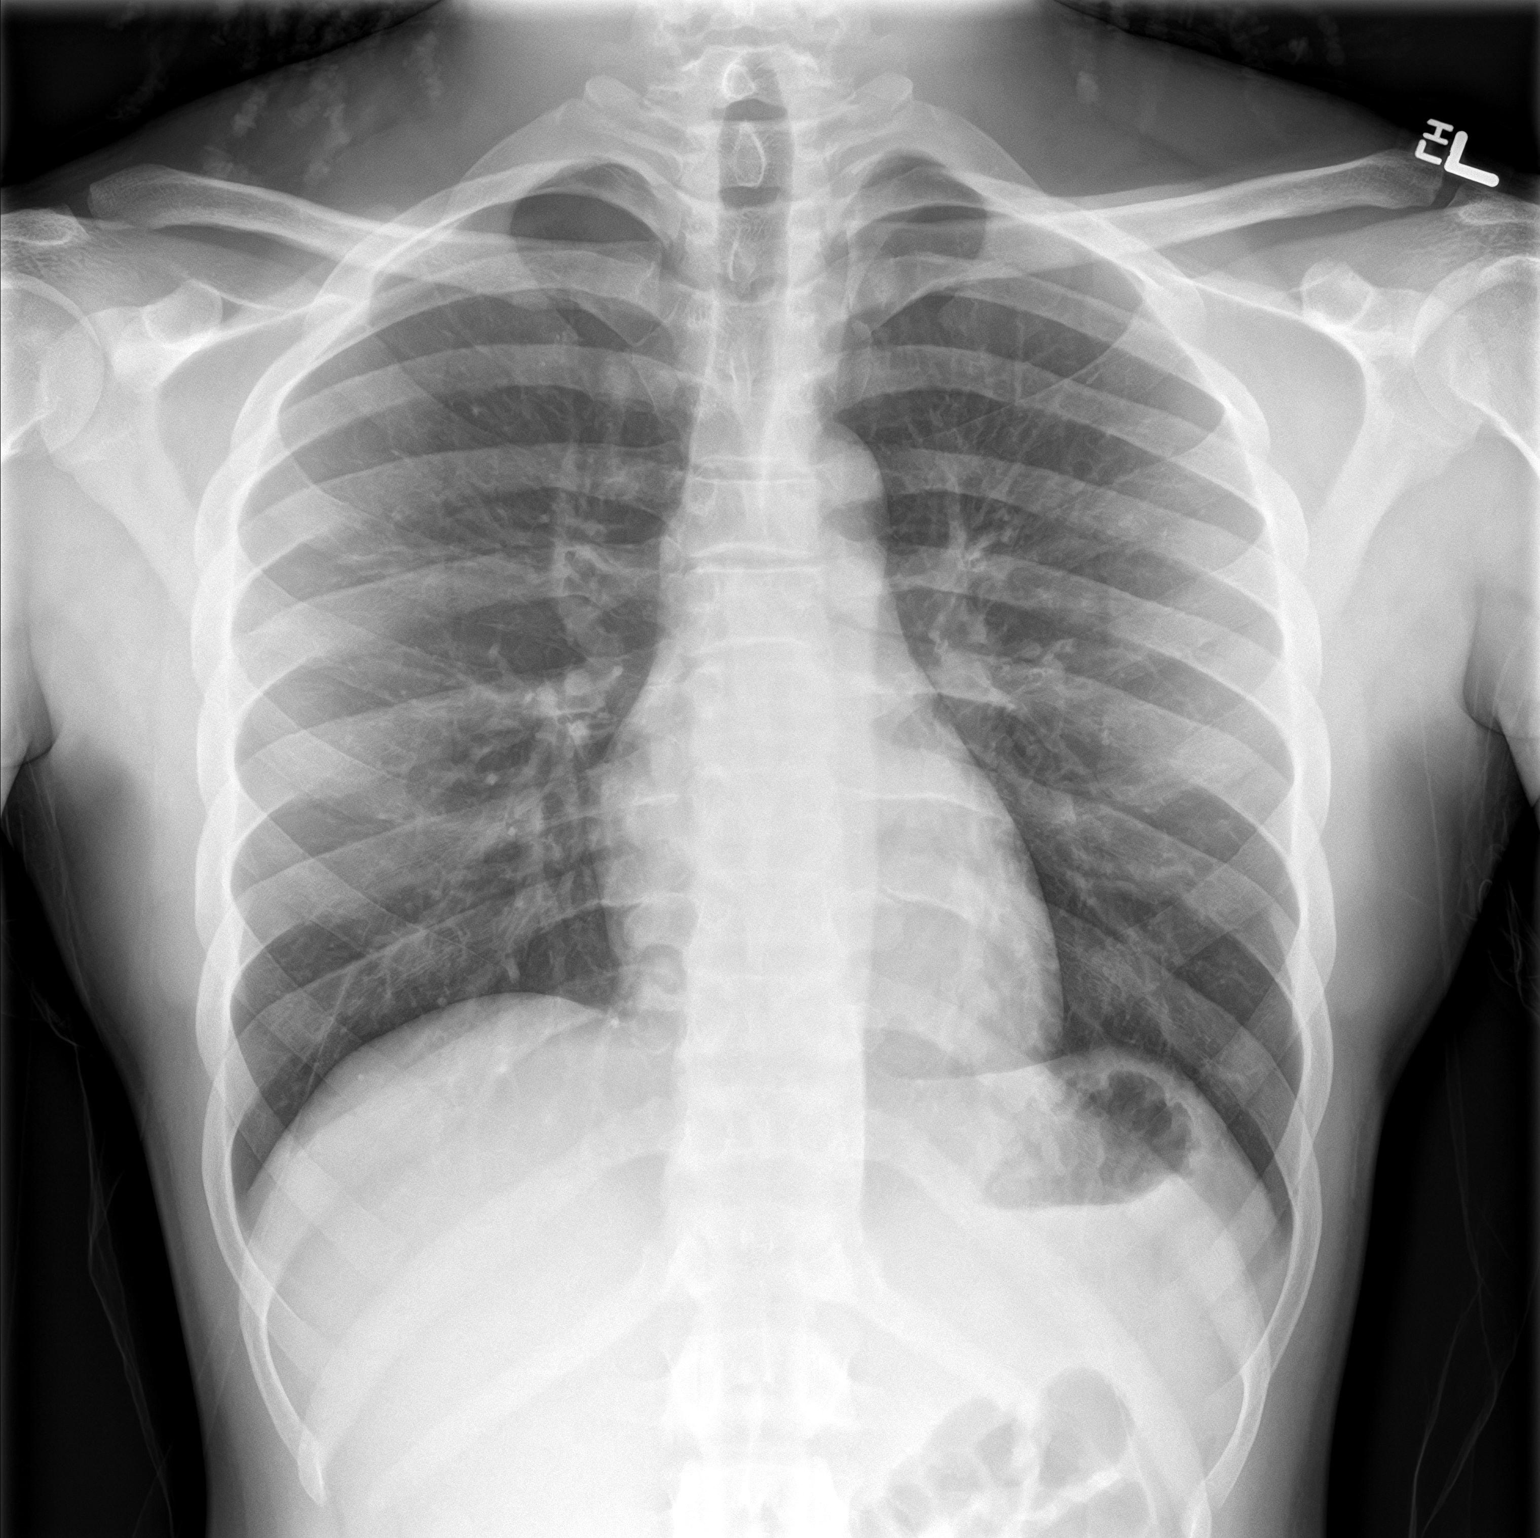
[im 2/2]
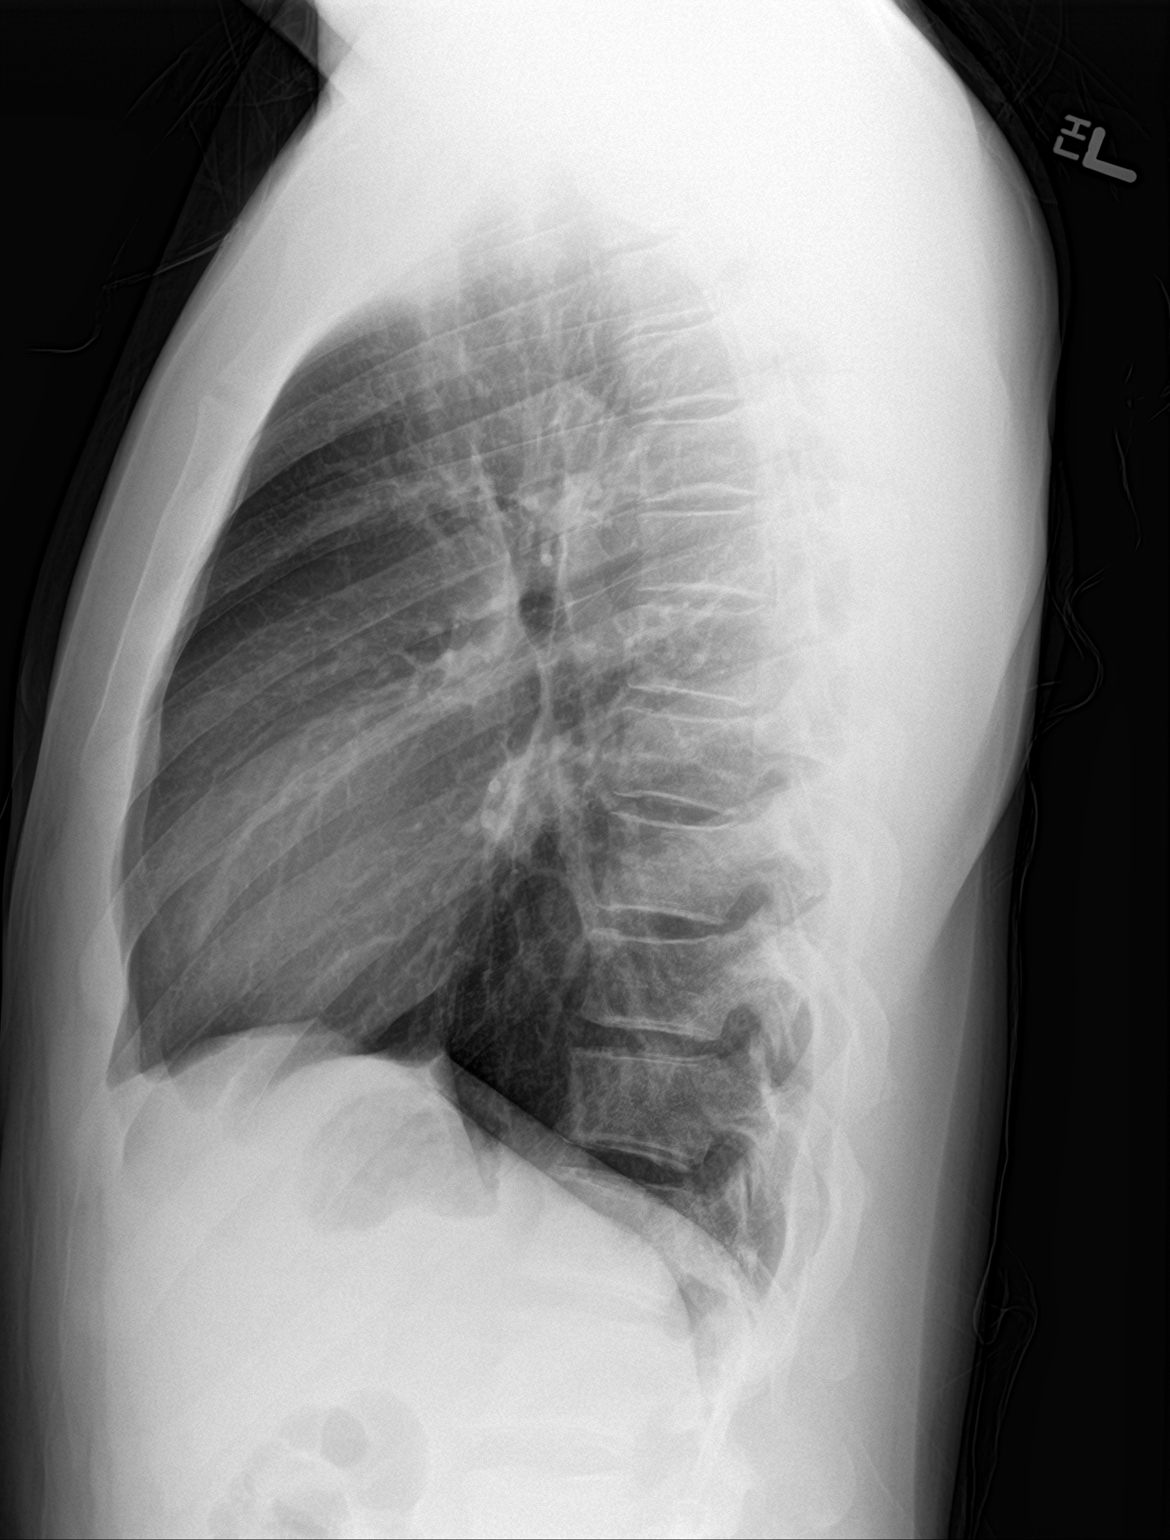

[2 of 2 positions shown; findings below may reference images not displayed]

FINDINGS: The heart size and mediastinal contours are within normal limits.
Both lungs are clear. The visualized skeletal structures are
unremarkable.
IMPRESSION: Normal exam.

## 2024-01-11 ENCOUNTER — Emergency Department
Admission: EM | Admit: 2024-01-11 | Discharge: 2024-01-11 | Disposition: A | Discharge status: Home / Self Care | Attending: Emergency Medicine | Admitting: Emergency Medicine

## 2024-01-11 ENCOUNTER — Encounter: Payer: Self-pay | Admitting: Emergency Medicine

## 2024-01-11 ENCOUNTER — Other Ambulatory Visit: Payer: Self-pay

## 2024-01-11 DIAGNOSIS — M4316 Spondylolisthesis, lumbar region: Secondary | ICD-10-CM | POA: Insufficient documentation

## 2024-01-11 DIAGNOSIS — G8929 Other chronic pain: Secondary | ICD-10-CM | POA: Insufficient documentation

## 2024-01-11 DIAGNOSIS — M545 Low back pain, unspecified: Secondary | ICD-10-CM | POA: Diagnosis present

## 2024-01-11 MED ORDER — MELOXICAM 15 MG PO TABS: 15.0000 mg | ORAL_TABLET | Freq: Every day | ORAL | 0 refills | Status: AC

## 2024-01-11 MED ORDER — CYCLOBENZAPRINE HCL 5 MG PO TABS: 5.0000 mg | ORAL_TABLET | Freq: Three times a day (TID) | ORAL | 0 refills | Status: AC | PRN

## 2024-01-11 NOTE — ED Provider Notes (Signed)
 [place] [facility] AT [facility] Provider Note   CSN: [id number] Arrival date & time: 01/11/24  1751     Patient presents with: Back Pain   Norman Rubio is a 28 y.o. male.  With no past medical history presents to the emergency department for evaluation of chronic lower back pain.  He is had years of intermittent episodes of back pain but over the last couple months has been having continued pain along the left lower lumbar spine.  He describes a aching pain with occasional muscle spasms.  No numbness tingling radicular symptoms.  No groin or thigh pain.  No fevers.  No urinary symptoms.  He has had x-rays of the lumbar spine that are reviewed by me today from [facility] in 2023 that have some mild retrolisthesis of L4 on L5.  He denies any weakness in the lower extremities.  Has been taking Tylenol  daily but no NSAIDs.  Normal kidney function.      Prior to Admission medications   Medication Sig Start Date End Date Taking? Authorizing Provider  cyclobenzaprine  (FLEXERIL ) 5 MG tablet Take 1-2 tablets (5-10 mg total) by mouth 3 (three) times daily as needed for muscle spasms. 01/11/24  Yes Charlene Debby BROCKS, PA-C  meloxicam  (MOBIC ) 15 MG tablet Take 1 tablet (15 mg total) by mouth daily. 01/11/24 01/10/25 Yes Charlene Debby BROCKS, PA-C    Allergies: Patient has no known allergies.    Review of Systems  Updated Vital Signs BP 125/89 (BP Location: Right Arm)   Pulse 73   Temp 98.4 F (36.9 C) (Oral)   Resp 15   SpO2 100%   Physical Exam Constitutional:      Appearance: He is well-developed.  HENT:     Head: Normocephalic and atraumatic.  Eyes:     Conjunctiva/sclera: Conjunctivae normal.  Cardiovascular:     Rate and Rhythm: Normal rate.  Pulmonary:     Effort: Pulmonary effort is normal. No respiratory distress.  Musculoskeletal:     Cervical back: Normal range of motion.     Comments: Lumbar Spine: Examination of the lumbar spine reveals no bony abnormality, no  edema, and no ecchymosis.  There is no step off.  The patient has full range of motion of the lumbar spine with flexion and extension.  The patient has normal lateral bend and rotation.  The patient has no pain with range of motion activities.  The patient has a negative axial load test, and a negative rotational Waddell test.  The patient is non tender along the spinous process.  Minimally tender along the left lower lumbar facet region at the lower lumbar spine at the lumbosacral junction.  The patient is non tender along the iliac crest.  The patient is non tender in the sciatic notch.  The patient is non tender along the Sacroiliac joint.  There is no Coccyx joint tenderness.    Bilateral Lower Extremities: Examination of the lower extremities reveals no bony abnormality, no edema, and no ecchymosis.  The patient has full active and passive range of motion of the hips, knees, and ankles.  There is no discomfort with range of motion exercises.  The patient is non tender along the greater trochanter region.  The patient has a negative Toula' test bilaterally.  There is normal skin warmth.  There is normal capillary refill bilaterally.    Neurologic: The patient has a negative straight leg raise.  The patient has normal muscle strength testing for the quadriceps, calves,  ankle dorsiflexion, ankle plantarflexion, and extensor hallicus longus.  The patient has sensation that is intact to light touch.    Skin:    General: Skin is warm.     Findings: No rash.  Neurological:     Mental Status: He is alert and oriented to person, place, and time.  Psychiatric:        Behavior: Behavior normal.        Thought Content: Thought content normal.     (all labs ordered are listed, but only abnormal results are displayed) Labs Reviewed - No data to display  EKG: None  Radiology: No results found.   Procedures   Medications Ordered in the ED - No data to display Study: Lumbar spine, 5 views    Comparison: 10/28/2020   Indication: BACK PAIN, M54.50 Low back pain, unspecified, G89.29 Other  chronic pain   Findings:  Vertebral body heights appear unchanged. Unchanged trace retrolisthesis of  L4 on L5. No definite degenerative disc or facet disease. The sacroiliac  joints are unremarkable. Horizontally oriented coccyx is unchanged.   Impression:  No acute findings.   Electronically Signed by:  Alexie Riofrio, MD, [facility]  Electronically Signed on:  04/20/2022 7:47 PM                                  Medical Decision Making Risk Prescription drug management.   28 year old male with nontraumatic acute midline lower back pain.  No radiculopathy.  No neurological deficits.  History of mild retrolisthesis of L4 on L5.  His pain consistent with mechanical low back pain.  Does not appear to have any sciatica symptoms.  He is afebrile normal vital signs.  Will place him on a trial of anti-inflammatory medications, meloxicam  daily with food.  His kidney function within normal limits.  He is given a prescription for muscle relaxer.  Encouraged to follow-up with spine specialist as his symptoms have been chronic.  He is given strict return precautions and understands signs symptoms return to the ER for. Final diagnoses:  Chronic left-sided low back pain without sciatica  Spondylolisthesis of lumbar region    ED Discharge Orders          Ordered    meloxicam  (MOBIC ) 15 MG tablet  Daily        01/11/24 2121    cyclobenzaprine  (FLEXERIL ) 5 MG tablet  3 times daily PRN        01/11/24 2121               Nolah Krenzer C, PA-C 01/11/24 2204    Claudene Rover, MD 01/11/24 2330

## 2024-01-11 NOTE — ED Triage Notes (Addendum)
 Pt reports mid lower back pain that started last month. Pt reports he was seen for the same last month but it still bothering me. Pt reports when lying flat hurts it worse. Denies injury to the area. Denies any numbness or tingling in lower extremities.

## 2024-01-11 NOTE — Discharge Instructions (Addendum)
 Please take meloxicam  daily.  You may take Tylenol  for additional pain relief.  Use muscle relaxers as needed for any muscle spasms.  Avoid heavy lifting pushing and pulling.  Follow-up with neurosurgeon.  Return to the ER for any numbness tingling or weakness in the lower extremities or any severe increasing back pain.
